# Patient Record
Sex: Female | Born: 1955 | Race: White | Hispanic: No | Marital: Married
Health system: Southern US, Community
[De-identification: ages and names within clinical notes are randomized; demographics above are authoritative.]

---

## 2010-04-07 ENCOUNTER — Ambulatory Visit: Payer: Self-pay | Admitting: Family Medicine

## 2010-04-16 ENCOUNTER — Inpatient Hospital Stay: Payer: Self-pay | Admitting: Surgery

## 2010-04-21 LAB — PATHOLOGY REPORT

## 2010-05-05 ENCOUNTER — Ambulatory Visit: Payer: Self-pay | Admitting: Internal Medicine

## 2010-05-24 ENCOUNTER — Ambulatory Visit: Payer: Self-pay | Admitting: Internal Medicine

## 2010-06-24 ENCOUNTER — Ambulatory Visit: Payer: Self-pay | Admitting: Internal Medicine

## 2010-07-11 ENCOUNTER — Ambulatory Visit: Payer: Self-pay | Admitting: Surgery

## 2010-07-13 ENCOUNTER — Ambulatory Visit: Payer: Self-pay | Admitting: Surgery

## 2010-07-24 ENCOUNTER — Ambulatory Visit: Payer: Self-pay | Admitting: Internal Medicine

## 2010-08-24 ENCOUNTER — Ambulatory Visit: Payer: Self-pay | Admitting: Internal Medicine

## 2010-09-24 ENCOUNTER — Ambulatory Visit: Payer: Self-pay | Admitting: Internal Medicine

## 2010-10-24 ENCOUNTER — Ambulatory Visit: Payer: Self-pay | Admitting: Internal Medicine

## 2010-11-24 ENCOUNTER — Ambulatory Visit: Payer: Self-pay | Admitting: Internal Medicine

## 2010-12-24 ENCOUNTER — Ambulatory Visit: Payer: Self-pay | Admitting: Internal Medicine

## 2010-12-30 LAB — CEA: CEA: 2.5 ng/mL (ref 0.0–4.7)

## 2011-01-12 ENCOUNTER — Inpatient Hospital Stay: Payer: Self-pay | Admitting: Internal Medicine

## 2011-01-24 ENCOUNTER — Ambulatory Visit: Payer: Self-pay | Admitting: Internal Medicine

## 2011-01-26 LAB — BASIC METABOLIC PANEL
Calcium, Total: 8.9 mg/dL (ref 8.5–10.1)
Co2: 30 mmol/L (ref 21–32)
EGFR (Non-African Amer.): >60
Osmolality: 274 (ref 275–301)
Potassium: 3.1 mmol/L — ABNORMAL LOW (ref 3.5–5.1)
Sodium: 136 mmol/L (ref 136–145)

## 2011-01-26 LAB — CBC CANCER CENTER
Eosinophil %: 0.1 %
Monocyte #: 0.4 x10 3/mm (ref 0.0–0.7)
Monocyte %: 6.9 %
Neutrophil %: 73.5 %
Platelet: 241 x10 3/mm (ref 150–440)
RBC: 4.2 10*6/uL (ref 3.80–5.20)
WBC: 5.1 x10 3/mm (ref 3.6–11.0)

## 2011-01-26 LAB — HEPATIC FUNCTION PANEL A (ARMC)
Albumin: 3.8 g/dL (ref 3.4–5.0)
Bilirubin, Direct: 0.2 mg/dL (ref 0.00–0.20)
SGOT(AST): 61 U/L — ABNORMAL HIGH (ref 15–37)
Total Protein: 7.4 g/dL (ref 6.4–8.2)

## 2011-01-31 ENCOUNTER — Inpatient Hospital Stay: Payer: Self-pay | Admitting: Internal Medicine

## 2011-01-31 LAB — COMPREHENSIVE METABOLIC PANEL
Alkaline Phosphatase: 210 U/L — ABNORMAL HIGH (ref 50–136)
BUN: 10 mg/dL (ref 7–18)
Bilirubin,Total: 0.5 mg/dL (ref 0.2–1.0)
Co2: 29 mmol/L (ref 21–32)
Creatinine: 0.65 mg/dL (ref 0.60–1.30)
EGFR (Non-African Amer.): >60
Osmolality: 273 (ref 275–301)
Potassium: 3.4 mmol/L — ABNORMAL LOW (ref 3.5–5.1)
SGPT (ALT): 34 U/L
Sodium: 137 mmol/L (ref 136–145)
Total Protein: 6.9 g/dL (ref 6.4–8.2)

## 2011-01-31 LAB — CBC WITH DIFFERENTIAL/PLATELET
Basophil %: 0.4 %
Eosinophil #: 0 10*3/uL (ref 0.0–0.7)
Eosinophil %: 0.1 %
HCT: 35 % (ref 35.0–47.0)
HGB: 11.1 g/dL — ABNORMAL LOW (ref 12.0–16.0)
MCH: 26.5 pg (ref 26.0–34.0)
MCHC: 31.8 g/dL — ABNORMAL LOW (ref 32.0–36.0)
MCV: 84 fL (ref 80–100)
Monocyte #: 0.4 10*3/uL (ref 0.0–0.7)
Neutrophil #: 3.1 10*3/uL (ref 1.4–6.5)
Neutrophil %: 62 %
Platelet: 210 10*3/uL (ref 150–440)
RBC: 4.19 10*6/uL (ref 3.80–5.20)

## 2011-01-31 LAB — PROTIME-INR: Prothrombin Time: 14.6 secs (ref 11.5–14.7)

## 2011-02-01 LAB — BASIC METABOLIC PANEL
Anion Gap: 9 (ref 7–16)
Calcium, Total: 8.4 mg/dL — ABNORMAL LOW (ref 8.5–10.1)
Chloride: 98 mmol/L (ref 98–107)
Co2: 28 mmol/L (ref 21–32)
Creatinine: 0.77 mg/dL (ref 0.60–1.30)
EGFR (African American): >60
Osmolality: 269 (ref 275–301)
Potassium: 3 mmol/L — ABNORMAL LOW (ref 3.5–5.1)
Sodium: 135 mmol/L — ABNORMAL LOW (ref 136–145)

## 2011-02-01 LAB — CBC WITH DIFFERENTIAL/PLATELET
Eosinophil #: 0 10*3/uL (ref 0.0–0.7)
Eosinophil %: 0.7 %
Lymphocyte #: 1.4 10*3/uL (ref 1.0–3.6)
MCH: 26.8 pg (ref 26.0–34.0)
MCHC: 32.6 g/dL (ref 32.0–36.0)
MCV: 82 fL (ref 80–100)
Monocyte #: 0.3 10*3/uL (ref 0.0–0.7)
Neutrophil %: 49.7 %
Platelet: 174 10*3/uL (ref 150–440)
RBC: 3.75 10*6/uL — ABNORMAL LOW (ref 3.80–5.20)
RDW: 17.5 % — ABNORMAL HIGH (ref 11.5–14.5)

## 2011-02-02 LAB — CBC WITH DIFFERENTIAL/PLATELET
Basophil #: 0 10*3/uL (ref 0.0–0.1)
Basophil %: 0.6 %
Eosinophil #: 0 10*3/uL (ref 0.0–0.7)
HCT: 29.6 % — ABNORMAL LOW (ref 35.0–47.0)
HGB: 9.6 g/dL — ABNORMAL LOW (ref 12.0–16.0)
MCH: 26.8 pg (ref 26.0–34.0)
MCHC: 32.5 g/dL (ref 32.0–36.0)
Monocyte #: 0.2 10*3/uL (ref 0.0–0.7)
Monocyte %: 10.3 %
Neutrophil #: 1.1 10*3/uL — ABNORMAL LOW (ref 1.4–6.5)
Neutrophil %: 46.5 %
RDW: 17.9 % — ABNORMAL HIGH (ref 11.5–14.5)
WBC: 2.4 10*3/uL — ABNORMAL LOW (ref 3.6–11.0)

## 2011-02-02 LAB — BASIC METABOLIC PANEL
Anion Gap: 7 (ref 7–16)
BUN: 5 mg/dL — ABNORMAL LOW (ref 7–18)
Calcium, Total: 7.5 mg/dL — ABNORMAL LOW (ref 8.5–10.1)
Chloride: 109 mmol/L — ABNORMAL HIGH (ref 98–107)
Creatinine: 0.71 mg/dL (ref 0.60–1.30)
Glucose: 103 mg/dL — ABNORMAL HIGH (ref 65–99)
Osmolality: 286 (ref 275–301)
Potassium: 3.4 mmol/L — ABNORMAL LOW (ref 3.5–5.1)

## 2011-02-02 LAB — MAGNESIUM: Magnesium: 1.7 mg/dL — ABNORMAL LOW

## 2011-02-03 LAB — CBC WITH DIFFERENTIAL/PLATELET
Basophil #: 0 10*3/uL (ref 0.0–0.1)
HCT: 25.2 % — ABNORMAL LOW (ref 35.0–47.0)
Lymphocyte #: 0.8 10*3/uL — ABNORMAL LOW (ref 1.0–3.6)
MCH: 26.8 pg (ref 26.0–34.0)
MCHC: 32.5 g/dL (ref 32.0–36.0)
MCV: 83 fL (ref 80–100)
Monocyte #: 0.1 10*3/uL (ref 0.0–0.7)
Monocyte %: 8.7 %
Neutrophil #: 0.8 10*3/uL — ABNORMAL LOW (ref 1.4–6.5)
Platelet: 113 10*3/uL — ABNORMAL LOW (ref 150–440)
RDW: 17.4 % — ABNORMAL HIGH (ref 11.5–14.5)
WBC: 1.7 10*3/uL — CL (ref 3.6–11.0)

## 2011-02-03 LAB — BASIC METABOLIC PANEL
Anion Gap: 10 (ref 7–16)
BUN: 5 mg/dL — ABNORMAL LOW (ref 7–18)
Calcium, Total: 7.5 mg/dL — ABNORMAL LOW (ref 8.5–10.1)
Co2: 25 mmol/L (ref 21–32)
Creatinine: 0.62 mg/dL (ref 0.60–1.30)
EGFR (African American): >60
EGFR (Non-African Amer.): >60
Glucose: 83 mg/dL (ref 65–99)
Sodium: 145 mmol/L (ref 136–145)

## 2011-02-04 LAB — CBC WITH DIFFERENTIAL/PLATELET
Basophil #: 0.1 10*3/uL (ref 0.0–0.1)
Basophil %: 1.1 %
Eosinophil #: 0.1 10*3/uL (ref 0.0–0.7)
HCT: 34.3 % — ABNORMAL LOW (ref 35.0–47.0)
HGB: 11.6 g/dL — ABNORMAL LOW (ref 12.0–16.0)
Lymphocyte #: 0.9 10*3/uL — ABNORMAL LOW (ref 1.0–3.6)
Lymphocyte %: 11.7 %
MCH: 28.1 pg (ref 26.0–34.0)
MCHC: 33.7 g/dL (ref 32.0–36.0)
MCV: 83 fL (ref 80–100)
Monocyte #: 0.3 10*3/uL (ref 0.0–0.7)
Neutrophil #: 6.5 10*3/uL (ref 1.4–6.5)
Platelet: 129 10*3/uL — ABNORMAL LOW (ref 150–440)
RDW: 16.5 % — ABNORMAL HIGH (ref 11.5–14.5)

## 2011-02-24 ENCOUNTER — Ambulatory Visit: Payer: Self-pay | Admitting: Internal Medicine

## 2011-07-14 ENCOUNTER — Ambulatory Visit: Payer: Self-pay | Admitting: Oncology

## 2011-07-14 LAB — CBC CANCER CENTER
Basophil #: 0 x10 3/mm (ref 0.0–0.1)
Eosinophil #: 0.1 x10 3/mm (ref 0.0–0.7)
MCH: 21.3 pg — ABNORMAL LOW (ref 26.0–34.0)
MCHC: 31.3 g/dL — ABNORMAL LOW (ref 32.0–36.0)
MCV: 68 fL — ABNORMAL LOW (ref 80–100)
Monocyte #: 0.3 x10 3/mm (ref 0.2–0.9)
Neutrophil #: 2.7 x10 3/mm (ref 1.4–6.5)
Platelet: 189 x10 3/mm (ref 150–440)
RBC: 5.39 10*6/uL — ABNORMAL HIGH (ref 3.80–5.20)
RDW: 17 % — ABNORMAL HIGH (ref 11.5–14.5)

## 2011-07-14 LAB — COMPREHENSIVE METABOLIC PANEL
Alkaline Phosphatase: 466 U/L — ABNORMAL HIGH (ref 50–136)
Anion Gap: 10 (ref 7–16)
BUN: 11 mg/dL (ref 7–18)
Calcium, Total: 8.5 mg/dL (ref 8.5–10.1)
Co2: 24 mmol/L (ref 21–32)
EGFR (Non-African Amer.): >60
Glucose: 117 mg/dL — ABNORMAL HIGH (ref 65–99)
Osmolality: 276 (ref 275–301)
Potassium: 3.2 mmol/L — ABNORMAL LOW (ref 3.5–5.1)
SGPT (ALT): 56 U/L

## 2011-07-19 ENCOUNTER — Ambulatory Visit: Payer: Self-pay | Admitting: Oncology

## 2011-07-24 ENCOUNTER — Ambulatory Visit: Payer: Self-pay | Admitting: Oncology

## 2011-08-10 LAB — CBC CANCER CENTER
Eosinophil #: 0.1 x10 3/mm (ref 0.0–0.7)
Eosinophil %: 1.3 %
Lymphocyte #: 1.4 x10 3/mm (ref 1.0–3.6)
MCH: 21.8 pg — ABNORMAL LOW (ref 26.0–34.0)
MCHC: 30.8 g/dL — ABNORMAL LOW (ref 32.0–36.0)
Monocyte #: 0.3 x10 3/mm (ref 0.2–0.9)
Monocyte %: 5.3 %
Neutrophil #: 3.5 x10 3/mm (ref 1.4–6.5)
Platelet: 161 x10 3/mm (ref 150–440)
RBC: 5.59 10*6/uL — ABNORMAL HIGH (ref 3.80–5.20)

## 2011-08-10 LAB — COMPREHENSIVE METABOLIC PANEL
Albumin: 3.7 g/dL (ref 3.4–5.0)
Anion Gap: 12 (ref 7–16)
BUN: 9 mg/dL (ref 7–18)
Calcium, Total: 8.3 mg/dL — ABNORMAL LOW (ref 8.5–10.1)
EGFR (African American): >60
Glucose: 108 mg/dL — ABNORMAL HIGH (ref 65–99)
Potassium: 3.5 mmol/L (ref 3.5–5.1)
SGOT(AST): 56 U/L — ABNORMAL HIGH (ref 15–37)
SGPT (ALT): 66 U/L
Sodium: 140 mmol/L (ref 136–145)
Total Protein: 6.4 g/dL (ref 6.4–8.2)

## 2011-08-24 ENCOUNTER — Ambulatory Visit: Payer: Self-pay | Admitting: Oncology

## 2011-08-25 LAB — CBC CANCER CENTER
Basophil %: 1.1 %
Eosinophil #: 0.1 x10 3/mm (ref 0.0–0.7)
HCT: 34.8 % — ABNORMAL LOW (ref 35.0–47.0)
HGB: 10.9 g/dL — ABNORMAL LOW (ref 12.0–16.0)
Lymphocyte #: 1.2 x10 3/mm (ref 1.0–3.6)
Lymphocyte %: 43.2 %
MCHC: 31.4 g/dL — ABNORMAL LOW (ref 32.0–36.0)
Monocyte %: 6.4 %
Neutrophil #: 1.3 x10 3/mm — ABNORMAL LOW (ref 1.4–6.5)
WBC: 2.8 x10 3/mm — ABNORMAL LOW (ref 3.6–11.0)

## 2011-08-25 LAB — COMPREHENSIVE METABOLIC PANEL
Albumin: 3.5 g/dL (ref 3.4–5.0)
Alkaline Phosphatase: 408 U/L — ABNORMAL HIGH (ref 50–136)
Anion Gap: 8 (ref 7–16)
BUN: 9 mg/dL (ref 7–18)
EGFR (African American): >60
EGFR (Non-African Amer.): >60
Glucose: 104 mg/dL — ABNORMAL HIGH (ref 65–99)
Osmolality: 282 (ref 275–301)
Potassium: 3.5 mmol/L (ref 3.5–5.1)
SGOT(AST): 69 U/L — ABNORMAL HIGH (ref 15–37)
SGPT (ALT): 105 U/L — ABNORMAL HIGH

## 2011-08-25 LAB — MAGNESIUM: Magnesium: 1.6 mg/dL — ABNORMAL LOW

## 2011-09-02 IMAGING — CT CT CHEST-ABD-PELV W/ CM
1 series · 1 of 1 positions shown, 3 images · non-contrast
Comparison: none

REASON FOR EXAM: staging advanced colon CA
COMMENTS:

[Series 1002: review_genericreadingtask result images · 0.72mm/px · 1 of 1 slices shown, 3 images]
[im 1/1  mediastinal]
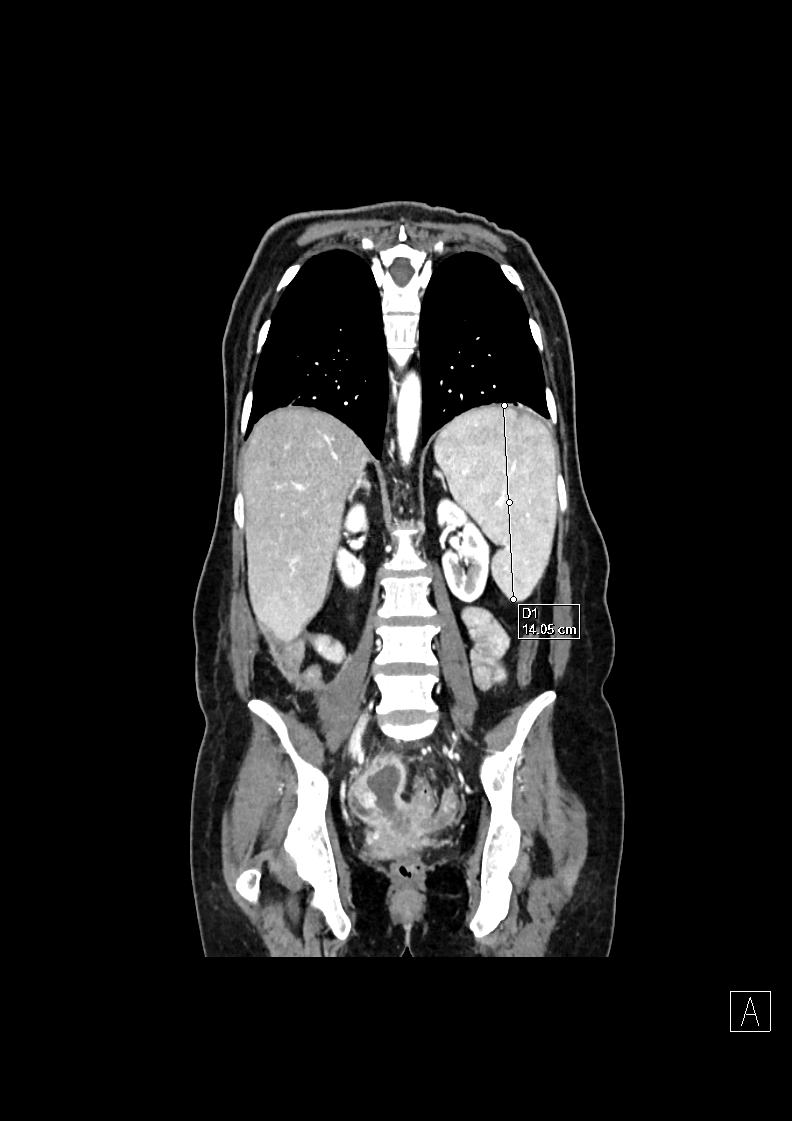
[im 1/1  lung]
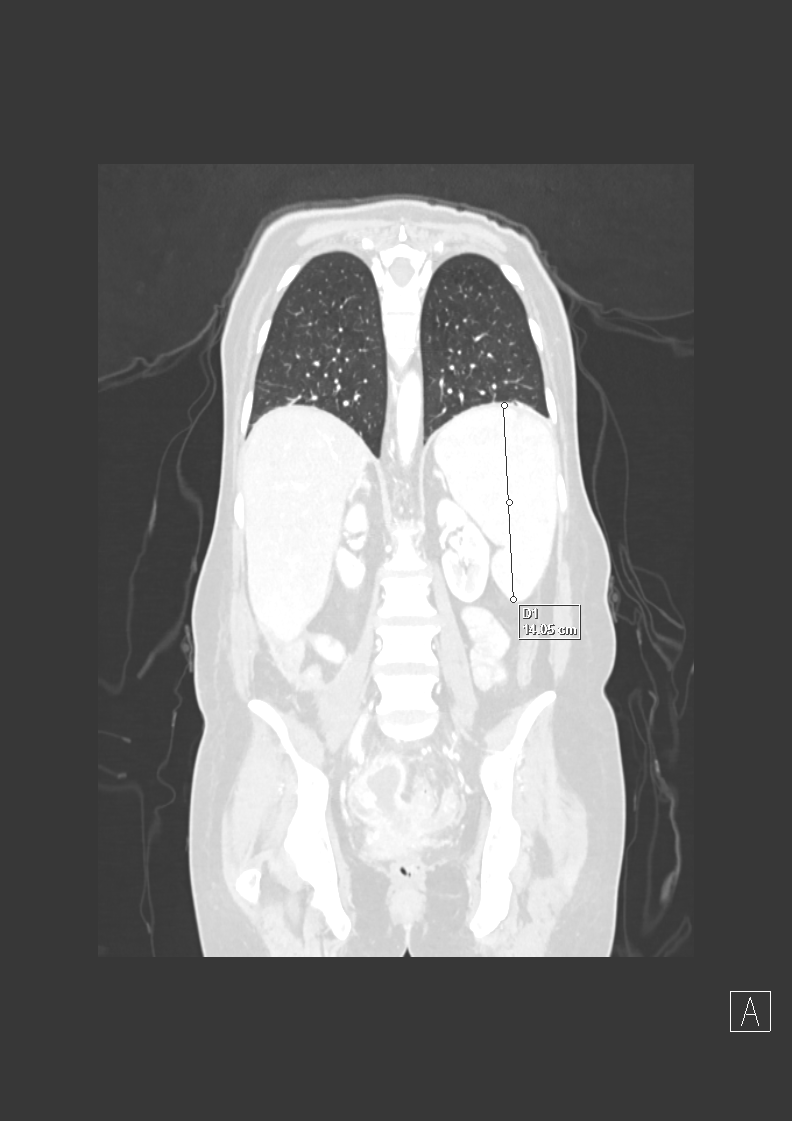
[im 1/1  bone]
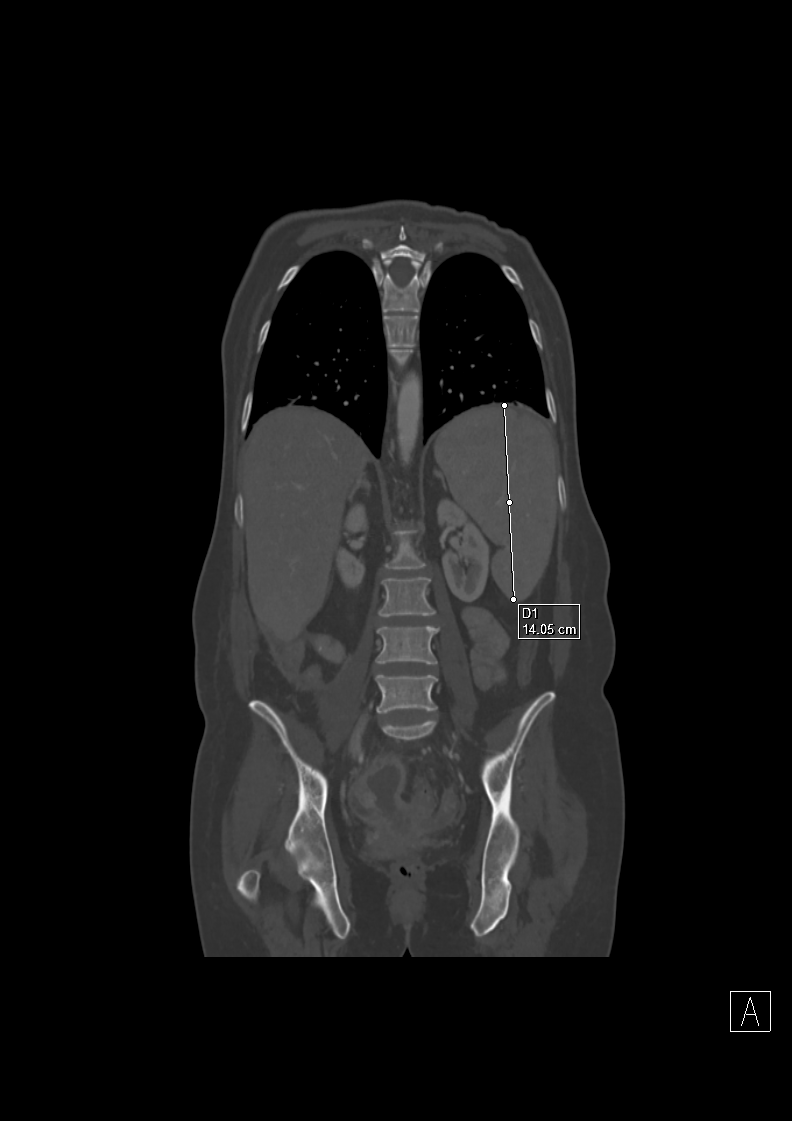

[1 of 1 positions shown; findings below may reference images not displayed]

PROCEDURE:     KCT - KCT CHEST ABDOMEN AND PELVIS W  - May 09, 2010 [DATE]

RESULT:     Axial CT scanning was performed through the chest, abdomen, and
pelvis at 5 mm intervals and slice thicknesses following intravenous
administration of 100 cc of Msovue-1UF. Review of multiplanar reconstructed
images was performed separately on the VIA monitor. The patient was unable
to drink more than a small amount of oral contrast due to nausea. There are
no previous studies for comparison. The patient reportedly is approximately
3 weeks out from the colonic resection and colostomy creation.

CT scan of the chest: At lung window settings there are mild emphysematous
changes bilaterally. I see no pulmonary parenchymal masses or infiltrates.
There is no pleural nor pericardial effusion. The cardiac chambers are
normal in size. The caliber of the thoracic aorta is normal. I see no
pathologic sized mediastinal or hilar lymph nodes. The thoracic vertebral
bodies are preserved in height.
CONCLUSION: I do not see evidence of acute intrathoracic abnormality.

CT scan of the abdomen and pelvis: The liver exhibits normal density with no
focal mass nor ductal dilation. The gallbladder is distended. I see no wall
thickening or pericholecystic fluid or stones. The pancreas exhibits no
acute abnormality. The spleen is mildly enlarged measuring 14 cm in greatest
dimension. The stomach is partially distended with contrast and fluid.

The patient has apparently undergone right colonic resection. An ileostomy
has been created on the right. A small amount of peritoneal fat has
herniated into the soft tissues surrounding the ileostomy. The mucous
fistula of the left colon lies in a left-sided ostomy site. The left colon
is collapsed. There are surgical clips in the inferior epigastrium. There is
a small amount of loculated fluid present deep to the rectus abdominal
musculature. This is closely applied to loops of bowel in this region. There
is no evidence of small bowel obstruction.

Within the pelvis there is a multiloculated fluid collection. This lies
posterior to the uterus and anterior to the rectum. It measures
approximately 3.4 cm transversely by 2.8 cm AP and appears multiloculated.

The lumbar vertebral bodies are preserved in height. I do not see bulky
intra-abdominal nor pelvic lymphadenopathy. The caliber of the abdominal
aorta is normal. The urinary bladder is nondistended. The kidneys enhance
well. There are no adrenal masses.
IMPRESSION: 1. Please see the discussion above regarding findings in the thorax.
2. There are fluid collections in the inferior epigastrium as well as within
the pelvis. These may reflect seromas or hematomas or possibly abscesses.
They appear multiloculated. There is a small amount of free fluid in the
left perirectal region.
3. There is mild splenomegaly.
4. There is dilation the gallbladder which may be related to the relative
fasting state.
5. There is a small amount of peritoneal fat that has herniated into the
right-sided ileostomy site I do not see evidence of bowel obstruction nor
free extraluminal gas collections.

A brief report was called by me and left on Dr. Ebadat voice mail at [DATE]
a.m. on 09 May, 2010. The final report was released for review at
approximately [DATE] a.m.(*)

## 2011-09-08 LAB — COMPREHENSIVE METABOLIC PANEL
Albumin: 3.7 g/dL (ref 3.4–5.0)
Alkaline Phosphatase: 471 U/L — ABNORMAL HIGH (ref 50–136)
BUN: 10 mg/dL (ref 7–18)
Bilirubin,Total: 0.5 mg/dL (ref 0.2–1.0)
Co2: 27 mmol/L (ref 21–32)
Creatinine: 0.9 mg/dL (ref 0.60–1.30)
EGFR (Non-African Amer.): >60
SGPT (ALT): 110 U/L — ABNORMAL HIGH (ref 12–78)

## 2011-09-08 LAB — CBC CANCER CENTER
Basophil #: 0 x10 3/mm (ref 0.0–0.1)
Basophil %: 0.5 %
Eosinophil #: 0 x10 3/mm (ref 0.0–0.7)
HGB: 11.6 g/dL — ABNORMAL LOW (ref 12.0–16.0)
Lymphocyte %: 35.3 %
MCH: 23 pg — ABNORMAL LOW (ref 26.0–34.0)
MCHC: 31.6 g/dL — ABNORMAL LOW (ref 32.0–36.0)
MCV: 73 fL — ABNORMAL LOW (ref 80–100)
Monocyte #: 0.2 x10 3/mm (ref 0.2–0.9)
Neutrophil %: 56 %
Platelet: 120 x10 3/mm — ABNORMAL LOW (ref 150–440)

## 2011-09-09 LAB — CEA: CEA: 6.5 ng/mL — ABNORMAL HIGH (ref 0.0–4.7)

## 2011-09-22 LAB — CBC CANCER CENTER
Basophil #: 0 x10 3/mm (ref 0.0–0.1)
Basophil %: 0.5 %
Eosinophil %: 0.1 %
HGB: 12.7 g/dL (ref 12.0–16.0)
Lymphocyte #: 1.5 x10 3/mm (ref 1.0–3.6)
MCH: 24.1 pg — ABNORMAL LOW (ref 26.0–34.0)
MCV: 75 fL — ABNORMAL LOW (ref 80–100)
Monocyte #: 0.4 x10 3/mm (ref 0.2–0.9)
Platelet: 141 x10 3/mm — ABNORMAL LOW (ref 150–440)
RBC: 5.26 10*6/uL — ABNORMAL HIGH (ref 3.80–5.20)
RDW: 23 % — ABNORMAL HIGH (ref 11.5–14.5)

## 2011-09-22 LAB — COMPREHENSIVE METABOLIC PANEL
Albumin: 3.7 g/dL (ref 3.4–5.0)
Alkaline Phosphatase: 539 U/L — ABNORMAL HIGH (ref 50–136)
Anion Gap: 8 (ref 7–16)
BUN: 10 mg/dL (ref 7–18)
Bilirubin,Total: 0.9 mg/dL (ref 0.2–1.0)
Calcium, Total: 8.9 mg/dL (ref 8.5–10.1)
Co2: 29 mmol/L (ref 21–32)
EGFR (African American): 58 — ABNORMAL LOW
EGFR (Non-African Amer.): 50 — ABNORMAL LOW
Glucose: 116 mg/dL — ABNORMAL HIGH (ref 65–99)
SGOT(AST): 88 U/L — ABNORMAL HIGH (ref 15–37)
SGPT (ALT): 143 U/L — ABNORMAL HIGH (ref 12–78)

## 2011-09-24 ENCOUNTER — Ambulatory Visit: Payer: Self-pay | Admitting: Oncology

## 2011-09-29 LAB — COMPREHENSIVE METABOLIC PANEL
Albumin: 3.6 g/dL (ref 3.4–5.0)
Alkaline Phosphatase: 403 U/L — ABNORMAL HIGH (ref 50–136)
Anion Gap: 8 (ref 7–16)
BUN: 12 mg/dL (ref 7–18)
Calcium, Total: 8.7 mg/dL (ref 8.5–10.1)
Co2: 26 mmol/L (ref 21–32)
Creatinine: 0.91 mg/dL (ref 0.60–1.30)
Glucose: 97 mg/dL (ref 65–99)
Osmolality: 281 (ref 275–301)
Sodium: 141 mmol/L (ref 136–145)
Total Protein: 6.3 g/dL — ABNORMAL LOW (ref 6.4–8.2)

## 2011-09-29 LAB — CBC CANCER CENTER
Basophil #: 0 x10 3/mm (ref 0.0–0.1)
Basophil %: 0.8 %
Eosinophil #: 0.1 x10 3/mm (ref 0.0–0.7)
HCT: 37.2 % (ref 35.0–47.0)
HGB: 11.9 g/dL — ABNORMAL LOW (ref 12.0–16.0)
Lymphocyte #: 1.3 x10 3/mm (ref 1.0–3.6)
Lymphocyte %: 40.6 %
MCH: 24.4 pg — ABNORMAL LOW (ref 26.0–34.0)
MCHC: 31.9 g/dL — ABNORMAL LOW (ref 32.0–36.0)
MCV: 76 fL — ABNORMAL LOW (ref 80–100)
Monocyte #: 0.3 x10 3/mm (ref 0.2–0.9)
Neutrophil #: 1.5 x10 3/mm (ref 1.4–6.5)
RDW: 24.5 % — ABNORMAL HIGH (ref 11.5–14.5)

## 2011-10-13 LAB — COMPREHENSIVE METABOLIC PANEL
Albumin: 3.3 g/dL — ABNORMAL LOW (ref 3.4–5.0)
Alkaline Phosphatase: 364 U/L — ABNORMAL HIGH (ref 50–136)
Bilirubin,Total: 0.5 mg/dL (ref 0.2–1.0)
Calcium, Total: 7.9 mg/dL — ABNORMAL LOW (ref 8.5–10.1)
Co2: 25 mmol/L (ref 21–32)
EGFR (Non-African Amer.): >60
Glucose: 102 mg/dL — ABNORMAL HIGH (ref 65–99)
Osmolality: 283 (ref 275–301)
SGOT(AST): 32 U/L (ref 15–37)
Sodium: 142 mmol/L (ref 136–145)

## 2011-10-13 LAB — CBC CANCER CENTER
Basophil #: 0 x10 3/mm (ref 0.0–0.1)
Basophil %: 0.9 %
HCT: 34.2 % — ABNORMAL LOW (ref 35.0–47.0)
Lymphocyte #: 0.9 x10 3/mm — ABNORMAL LOW (ref 1.0–3.6)
Lymphocyte %: 29.9 %
MCHC: 32.2 g/dL (ref 32.0–36.0)
MCV: 78 fL — ABNORMAL LOW (ref 80–100)
Monocyte #: 0.2 x10 3/mm (ref 0.2–0.9)
Monocyte %: 6.5 %
Neutrophil #: 1.8 x10 3/mm (ref 1.4–6.5)
Platelet: 96 x10 3/mm — ABNORMAL LOW (ref 150–440)
RDW: 23.1 % — ABNORMAL HIGH (ref 11.5–14.5)
WBC: 3 x10 3/mm — ABNORMAL LOW (ref 3.6–11.0)

## 2011-10-13 LAB — MAGNESIUM: Magnesium: 1.4 mg/dL — ABNORMAL LOW

## 2011-10-16 LAB — CEA: CEA: 5 ng/mL — ABNORMAL HIGH (ref 0.0–4.7)

## 2011-10-20 LAB — CBC CANCER CENTER
Basophil #: 0 x10 3/mm (ref 0.0–0.1)
Eosinophil %: 4.6 %
Lymphocyte #: 0.9 x10 3/mm — ABNORMAL LOW (ref 1.0–3.6)
MCH: 25.5 pg — ABNORMAL LOW (ref 26.0–34.0)
MCV: 79 fL — ABNORMAL LOW (ref 80–100)
Monocyte #: 0.3 x10 3/mm (ref 0.2–0.9)
Monocyte %: 10.9 %
Neutrophil #: 1.4 x10 3/mm (ref 1.4–6.5)
Platelet: 180 x10 3/mm (ref 150–440)
RDW: 22.3 % — ABNORMAL HIGH (ref 11.5–14.5)
WBC: 2.8 x10 3/mm — ABNORMAL LOW (ref 3.6–11.0)

## 2011-10-24 ENCOUNTER — Ambulatory Visit: Payer: Self-pay | Admitting: Oncology

## 2011-11-03 LAB — CBC CANCER CENTER
Basophil #: 0 x10 3/mm (ref 0.0–0.1)
Basophil %: 1.1 %
Eosinophil #: 0.1 x10 3/mm (ref 0.0–0.7)
Eosinophil %: 2.3 %
HGB: 11.8 g/dL — ABNORMAL LOW (ref 12.0–16.0)
Lymphocyte %: 33.3 %
MCH: 25.7 pg — ABNORMAL LOW (ref 26.0–34.0)
MCHC: 32.2 g/dL (ref 32.0–36.0)
Monocyte #: 0.2 x10 3/mm (ref 0.2–0.9)
Neutrophil %: 55.3 %
RDW: 20.9 % — ABNORMAL HIGH (ref 11.5–14.5)

## 2011-11-03 LAB — COMPREHENSIVE METABOLIC PANEL
BUN: 8 mg/dL (ref 7–18)
Bilirubin,Total: 0.7 mg/dL (ref 0.2–1.0)
Chloride: 106 mmol/L (ref 98–107)
Co2: 26 mmol/L (ref 21–32)
Creatinine: 0.73 mg/dL (ref 0.60–1.30)
EGFR (African American): >60
EGFR (Non-African Amer.): >60
Potassium: 3 mmol/L — ABNORMAL LOW (ref 3.5–5.1)
SGOT(AST): 34 U/L (ref 15–37)
SGPT (ALT): 43 U/L (ref 12–78)

## 2011-11-24 ENCOUNTER — Ambulatory Visit: Payer: Self-pay | Admitting: Oncology

## 2011-11-24 LAB — CBC CANCER CENTER
Basophil %: 2.3 %
Eosinophil #: 0.2 x10 3/mm (ref 0.0–0.7)
HCT: 39.2 % (ref 35.0–47.0)
HGB: 12.4 g/dL (ref 12.0–16.0)
Lymphocyte #: 1.2 x10 3/mm (ref 1.0–3.6)
Lymphocyte %: 21.4 %
MCH: 25.4 pg — ABNORMAL LOW (ref 26.0–34.0)
MCHC: 31.8 g/dL — ABNORMAL LOW (ref 32.0–36.0)
MCV: 80 fL (ref 80–100)
Monocyte %: 7.6 %
Neutrophil #: 3.7 x10 3/mm (ref 1.4–6.5)
Platelet: 228 x10 3/mm (ref 150–440)
RDW: 18.7 % — ABNORMAL HIGH (ref 11.5–14.5)
WBC: 5.7 x10 3/mm (ref 3.6–11.0)

## 2011-11-24 LAB — COMPREHENSIVE METABOLIC PANEL
Alkaline Phosphatase: 290 U/L — ABNORMAL HIGH (ref 50–136)
Anion Gap: 10 (ref 7–16)
BUN: 9 mg/dL (ref 7–18)
Calcium, Total: 8.5 mg/dL (ref 8.5–10.1)
Chloride: 104 mmol/L (ref 98–107)
Co2: 25 mmol/L (ref 21–32)
EGFR (Non-African Amer.): >60
Glucose: 109 mg/dL — ABNORMAL HIGH (ref 65–99)
Osmolality: 277 (ref 275–301)
Potassium: 3.6 mmol/L (ref 3.5–5.1)
SGOT(AST): 21 U/L (ref 15–37)
Total Protein: 6.4 g/dL (ref 6.4–8.2)

## 2011-11-27 LAB — CEA: CEA: 6.2 ng/mL — ABNORMAL HIGH (ref 0.0–4.7)

## 2011-12-01 LAB — COMPREHENSIVE METABOLIC PANEL
Albumin: 3.3 g/dL — ABNORMAL LOW (ref 3.4–5.0)
Anion Gap: 11 (ref 7–16)
Calcium, Total: 8.3 mg/dL — ABNORMAL LOW (ref 8.5–10.1)
Co2: 24 mmol/L (ref 21–32)
EGFR (Non-African Amer.): >60
Glucose: 117 mg/dL — ABNORMAL HIGH (ref 65–99)
Osmolality: 282 (ref 275–301)
Potassium: 4 mmol/L (ref 3.5–5.1)
SGOT(AST): 24 U/L (ref 15–37)
SGPT (ALT): 20 U/L (ref 12–78)
Sodium: 141 mmol/L (ref 136–145)
Total Protein: 6.1 g/dL — ABNORMAL LOW (ref 6.4–8.2)

## 2011-12-01 LAB — CBC CANCER CENTER
Basophil #: 0.1 x10 3/mm (ref 0.0–0.1)
Basophil %: 1.3 %
Eosinophil #: 0.1 x10 3/mm (ref 0.0–0.7)
HCT: 38.7 % (ref 35.0–47.0)
HGB: 12.2 g/dL (ref 12.0–16.0)
Lymphocyte #: 1.5 x10 3/mm (ref 1.0–3.6)
Lymphocyte %: 25.9 %
MCH: 25.1 pg — ABNORMAL LOW (ref 26.0–34.0)
MCHC: 31.6 g/dL — ABNORMAL LOW (ref 32.0–36.0)
Monocyte #: 0.3 x10 3/mm (ref 0.2–0.9)
Neutrophil #: 3.9 x10 3/mm (ref 1.4–6.5)
Platelet: 175 x10 3/mm (ref 150–440)
RDW: 18.2 % — ABNORMAL HIGH (ref 11.5–14.5)

## 2011-12-24 ENCOUNTER — Ambulatory Visit: Payer: Self-pay | Admitting: Oncology

## 2011-12-29 LAB — COMPREHENSIVE METABOLIC PANEL
Alkaline Phosphatase: 162 U/L — ABNORMAL HIGH (ref 50–136)
Anion Gap: 10 (ref 7–16)
Calcium, Total: 7.7 mg/dL — ABNORMAL LOW (ref 8.5–10.1)
Chloride: 110 mmol/L — ABNORMAL HIGH (ref 98–107)
Co2: 25 mmol/L (ref 21–32)
Creatinine: 0.69 mg/dL (ref 0.60–1.30)
EGFR (African American): >60
EGFR (Non-African Amer.): >60
Glucose: 102 mg/dL — ABNORMAL HIGH (ref 65–99)
Osmolality: 289 (ref 275–301)
Potassium: 3.3 mmol/L — ABNORMAL LOW (ref 3.5–5.1)
Sodium: 145 mmol/L (ref 136–145)

## 2011-12-29 LAB — CBC CANCER CENTER
Basophil %: 1 %
Eosinophil %: 1.5 %
HGB: 11.7 g/dL — ABNORMAL LOW (ref 12.0–16.0)
Lymphocyte #: 1.3 x10 3/mm (ref 1.0–3.6)
MCH: 24.8 pg — ABNORMAL LOW (ref 26.0–34.0)
MCV: 78 fL — ABNORMAL LOW (ref 80–100)
Monocyte #: 0.3 x10 3/mm (ref 0.2–0.9)
RDW: 18.9 % — ABNORMAL HIGH (ref 11.5–14.5)

## 2011-12-29 LAB — MAGNESIUM: Magnesium: 1 mg/dL — ABNORMAL LOW

## 2012-01-16 ENCOUNTER — Ambulatory Visit: Payer: Self-pay | Admitting: Oncology

## 2012-01-24 ENCOUNTER — Ambulatory Visit: Payer: Self-pay | Admitting: Oncology

## 2012-01-26 LAB — CBC CANCER CENTER
Basophil #: 0.1 x10 3/mm (ref 0.0–0.1)
Basophil %: 0.8 %
Eosinophil %: 1.8 %
HCT: 40.9 % (ref 35.0–47.0)
HGB: 13.3 g/dL (ref 12.0–16.0)
Lymphocyte #: 1.7 x10 3/mm (ref 1.0–3.6)
MCH: 25.2 pg — ABNORMAL LOW (ref 26.0–34.0)
MCHC: 32.5 g/dL (ref 32.0–36.0)
MCV: 78 fL — ABNORMAL LOW (ref 80–100)
Monocyte #: 0.4 x10 3/mm (ref 0.2–0.9)
Neutrophil #: 4 x10 3/mm (ref 1.4–6.5)
Platelet: 168 x10 3/mm (ref 150–440)
RBC: 5.27 10*6/uL — ABNORMAL HIGH (ref 3.80–5.20)
WBC: 6.2 x10 3/mm (ref 3.6–11.0)

## 2012-01-26 LAB — COMPREHENSIVE METABOLIC PANEL
Alkaline Phosphatase: 242 U/L — ABNORMAL HIGH (ref 50–136)
Calcium, Total: 8.7 mg/dL (ref 8.5–10.1)
Chloride: 102 mmol/L (ref 98–107)
EGFR (African American): >60
Potassium: 3.3 mmol/L — ABNORMAL LOW (ref 3.5–5.1)
Sodium: 143 mmol/L (ref 136–145)
Total Protein: 6.6 g/dL (ref 6.4–8.2)

## 2012-01-27 LAB — CEA: CEA: 5.3 ng/mL — ABNORMAL HIGH (ref 0.0–4.7)

## 2012-02-23 LAB — CBC CANCER CENTER
Basophil #: 0 x10 3/mm (ref 0.0–0.1)
Basophil %: 0.3 %
HCT: 42 % (ref 35.0–47.0)
HGB: 13.5 g/dL (ref 12.0–16.0)
Lymphocyte #: 0.6 x10 3/mm — ABNORMAL LOW (ref 1.0–3.6)
MCV: 78 fL — ABNORMAL LOW (ref 80–100)
Monocyte #: 0.4 x10 3/mm (ref 0.2–0.9)
Monocyte %: 3.7 %
Neutrophil #: 10.9 x10 3/mm — ABNORMAL HIGH (ref 1.4–6.5)
Neutrophil %: 91.2 %
Platelet: 144 x10 3/mm — ABNORMAL LOW (ref 150–440)
RBC: 5.4 10*6/uL — ABNORMAL HIGH (ref 3.80–5.20)

## 2012-02-23 LAB — COMPREHENSIVE METABOLIC PANEL
Albumin: 4.1 g/dL (ref 3.4–5.0)
Anion Gap: 13 (ref 7–16)
BUN: 13 mg/dL (ref 7–18)
Bilirubin,Total: 0.7 mg/dL (ref 0.2–1.0)
Calcium, Total: 8.7 mg/dL (ref 8.5–10.1)
Chloride: 105 mmol/L (ref 98–107)
Creatinine: 0.75 mg/dL (ref 0.60–1.30)
EGFR (Non-African Amer.): >60
Glucose: 104 mg/dL — ABNORMAL HIGH (ref 65–99)
Osmolality: 289 (ref 275–301)
Potassium: 3 mmol/L — ABNORMAL LOW (ref 3.5–5.1)
SGPT (ALT): 47 U/L (ref 12–78)
Sodium: 145 mmol/L (ref 136–145)

## 2012-02-24 ENCOUNTER — Ambulatory Visit: Payer: Self-pay | Admitting: Oncology

## 2012-03-01 LAB — CBC CANCER CENTER
Basophil #: 0.1 x10 3/mm (ref 0.0–0.1)
Basophil %: 0.8 %
Eosinophil #: 0.1 x10 3/mm (ref 0.0–0.7)
Eosinophil %: 1.4 %
Lymphocyte %: 20.8 %
Monocyte #: 0.4 x10 3/mm (ref 0.2–0.9)
Neutrophil #: 5.1 x10 3/mm (ref 1.4–6.5)
Neutrophil %: 71.1 %
RDW: 16.4 % — ABNORMAL HIGH (ref 11.5–14.5)
WBC: 7.1 x10 3/mm (ref 3.6–11.0)

## 2012-03-01 LAB — COMPREHENSIVE METABOLIC PANEL
Alkaline Phosphatase: 204 U/L — ABNORMAL HIGH (ref 50–136)
Anion Gap: 9 (ref 7–16)
BUN: 11 mg/dL (ref 7–18)
Bilirubin,Total: 0.4 mg/dL (ref 0.2–1.0)
Calcium, Total: 8.2 mg/dL — ABNORMAL LOW (ref 8.5–10.1)
Chloride: 106 mmol/L (ref 98–107)
Co2: 27 mmol/L (ref 21–32)
Creatinine: 0.77 mg/dL (ref 0.60–1.30)
EGFR (African American): >60
Glucose: 111 mg/dL — ABNORMAL HIGH (ref 65–99)
Potassium: 3.2 mmol/L — ABNORMAL LOW (ref 3.5–5.1)
SGOT(AST): 31 U/L (ref 15–37)
SGPT (ALT): 35 U/L (ref 12–78)
Total Protein: 6.3 g/dL — ABNORMAL LOW (ref 6.4–8.2)

## 2012-03-23 ENCOUNTER — Ambulatory Visit: Payer: Self-pay | Admitting: Oncology

## 2012-04-05 LAB — COMPREHENSIVE METABOLIC PANEL
BUN: 11 mg/dL (ref 7–18)
Bilirubin,Total: 0.4 mg/dL (ref 0.2–1.0)
Calcium, Total: 8.3 mg/dL — ABNORMAL LOW (ref 8.5–10.1)
Chloride: 99 mmol/L (ref 98–107)
Co2: 30 mmol/L (ref 21–32)
EGFR (Non-African Amer.): >60
Glucose: 93 mg/dL (ref 65–99)
Osmolality: 275 (ref 275–301)
Potassium: 3.4 mmol/L — ABNORMAL LOW (ref 3.5–5.1)
Sodium: 138 mmol/L (ref 136–145)

## 2012-04-05 LAB — CBC CANCER CENTER
Basophil #: 0 x10 3/mm (ref 0.0–0.1)
Eosinophil #: 0 x10 3/mm (ref 0.0–0.7)
HCT: 38.7 % (ref 35.0–47.0)
HGB: 12.5 g/dL (ref 12.0–16.0)
MCH: 24.6 pg — ABNORMAL LOW (ref 26.0–34.0)
Monocyte #: 0.3 x10 3/mm (ref 0.2–0.9)
Monocyte %: 7.2 %
Neutrophil #: 2.6 x10 3/mm (ref 1.4–6.5)
Neutrophil %: 61 %
RBC: 5.09 10*6/uL (ref 3.80–5.20)
RDW: 14.4 % (ref 11.5–14.5)
WBC: 4.3 x10 3/mm (ref 3.6–11.0)

## 2012-04-05 LAB — CREATININE, SERUM
EGFR (African American): >60
EGFR (Non-African Amer.): >60

## 2012-04-06 LAB — CEA: CEA: 3.8 ng/mL (ref 0.0–4.7)

## 2012-04-23 ENCOUNTER — Ambulatory Visit: Payer: Self-pay | Admitting: Oncology

## 2013-07-30 IMAGING — CT CT ABD-PELV W/ CM
1 of 2 series · 15 of 32 positions shown, 19 images · IV contrast (isovue)
Comparison: none

REASON FOR EXAM: Hx of colon CA Increasing abdominal pain
COMMENTS:

PROCEDURE:     MORAUX - MORAUX ABDOMEN / PELVIS W  - April 05, 2012 [DATE]
RESULT:     Comparison:  PET CT 01/16/2012, CT of the abdomen and pelvis
01/30/2011
TECHNIQUE: Multiple axial images of the abdomen and pelvis were performed
from the lung bases to the pubic symphysis, with p.o. contrast and with 75
mL of Isovue 300 intravenous contrast.

[Series 2: soft tissue · axial · 0.67mm/px · z∈[-796,-451]mm · 15 of 127 slices shown, 19 images]
[im 6/127  soft-tissue]
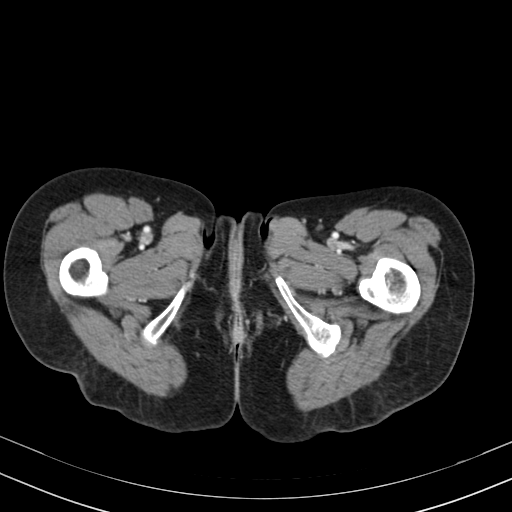
[im 6/127  bone]
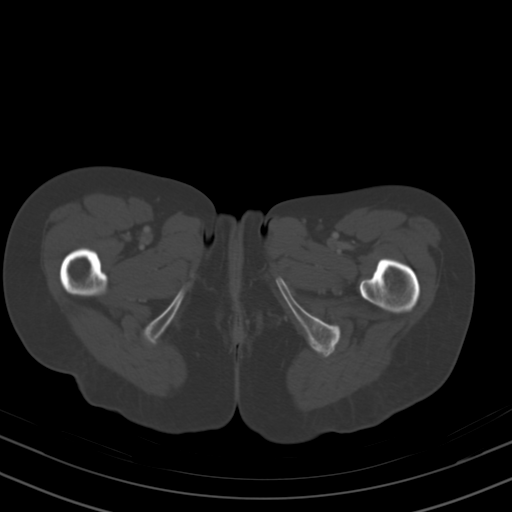
[im 16/127  soft-tissue]
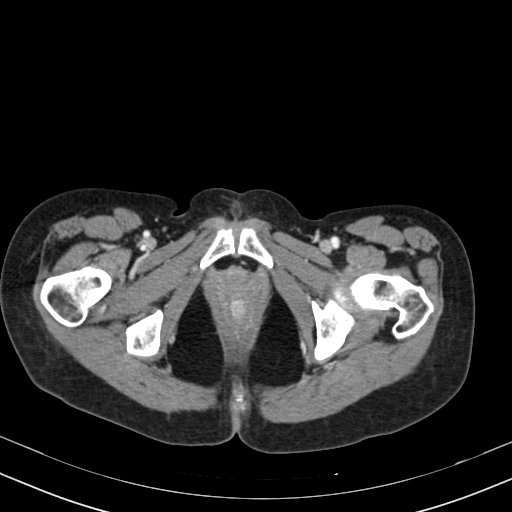
[im 26/127  soft-tissue]
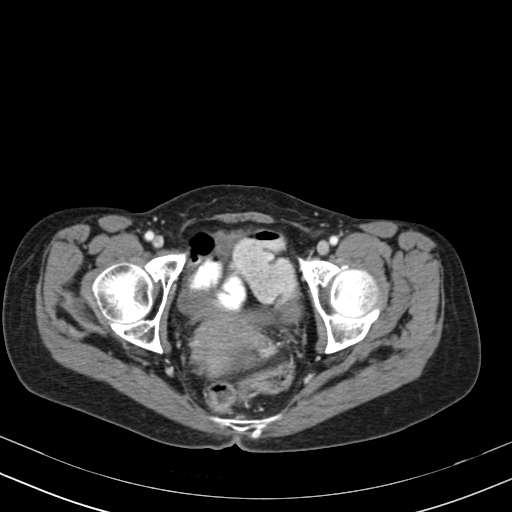
[im 36/127  soft-tissue]
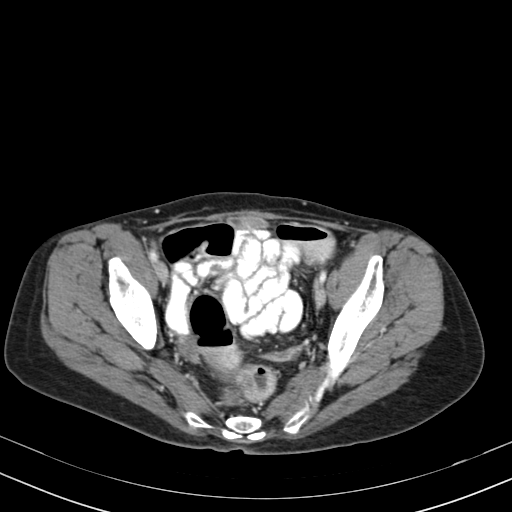
[im 46/127  soft-tissue]
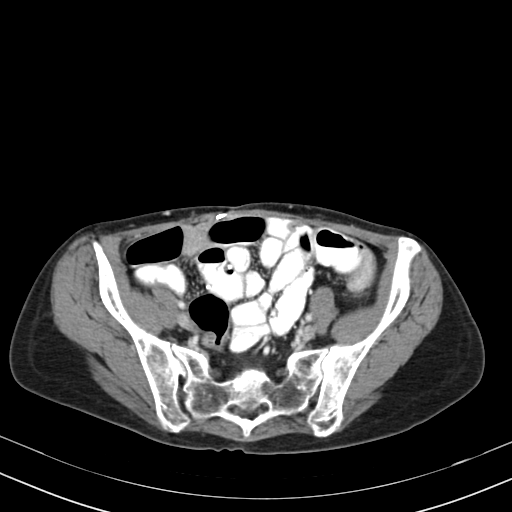
[im 56/127  soft-tissue]
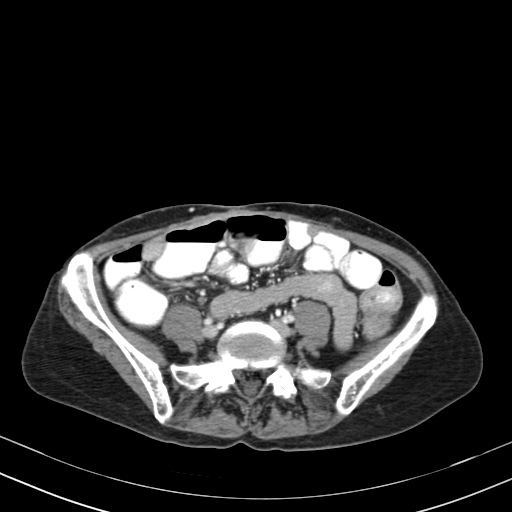
[im 66/127  soft-tissue]
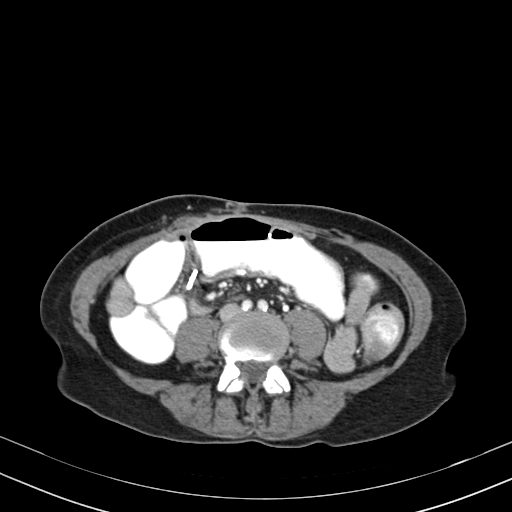
[im 71/127  soft-tissue]
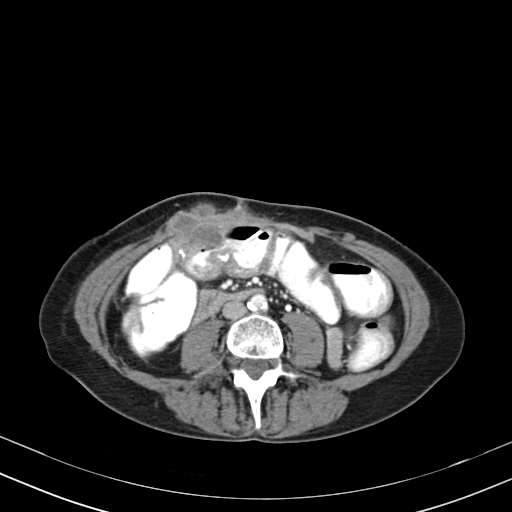
[im 81/127  soft-tissue]
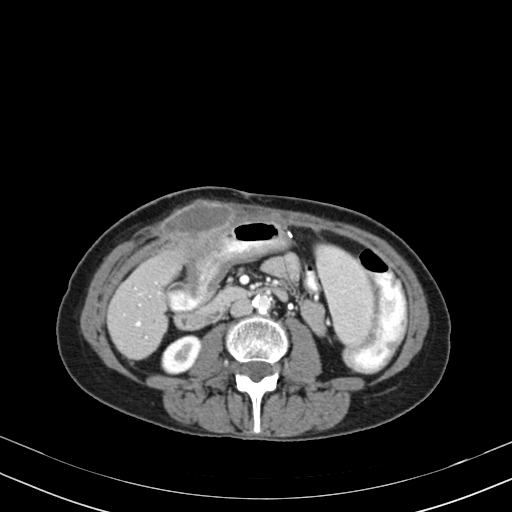
[im 81/127  bone]
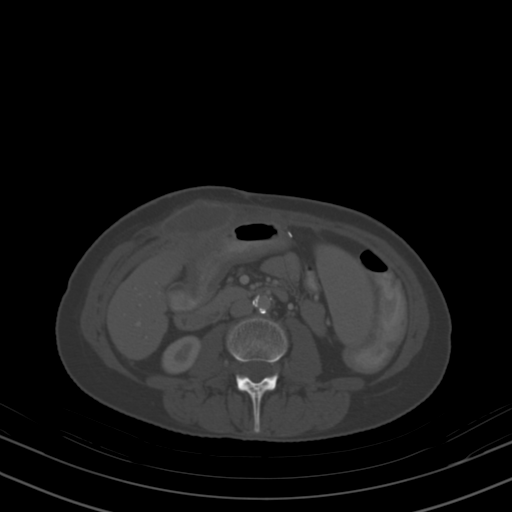
[im 91/127  soft-tissue]
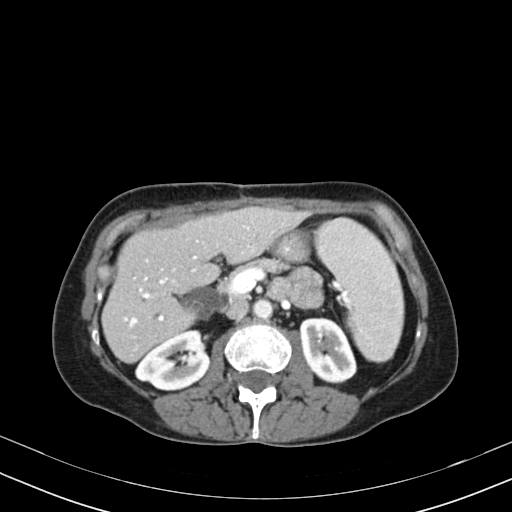
[im 101/127  soft-tissue]
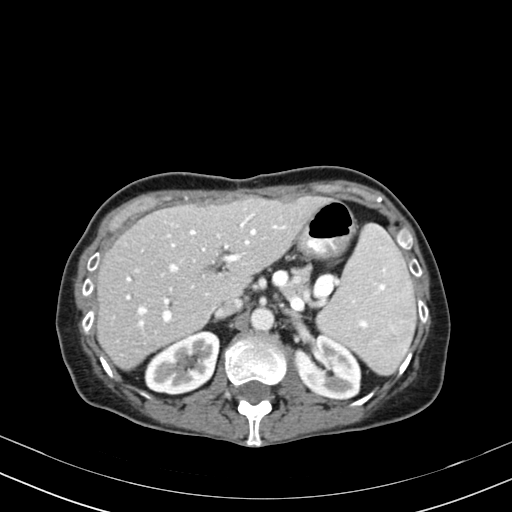
[im 106/127  lung]
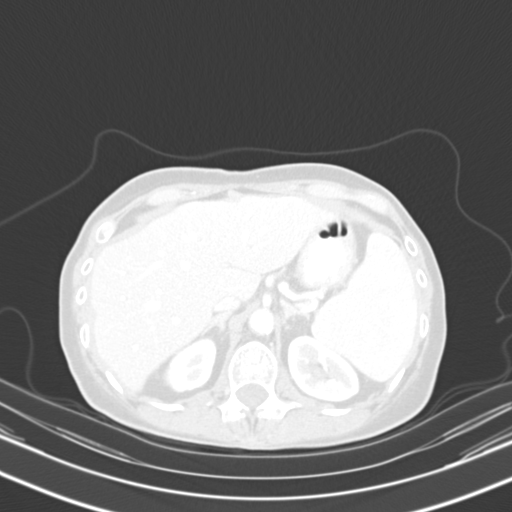
[im 111/127  soft-tissue]
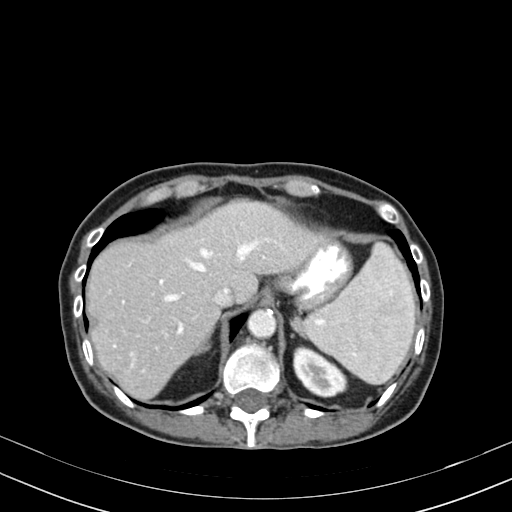
[im 111/127  lung]
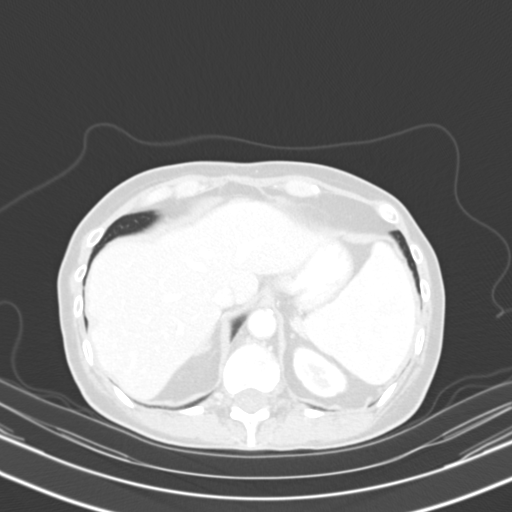
[im 116/127  lung]
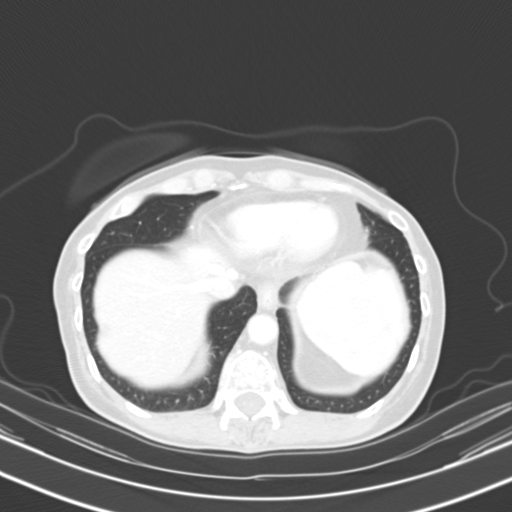
[im 121/127  soft-tissue]
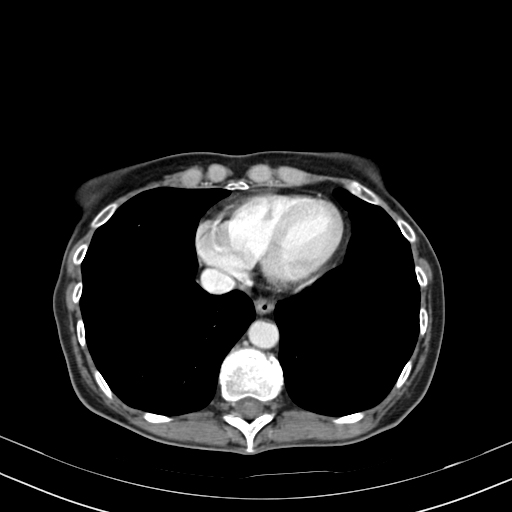
[im 121/127  lung]
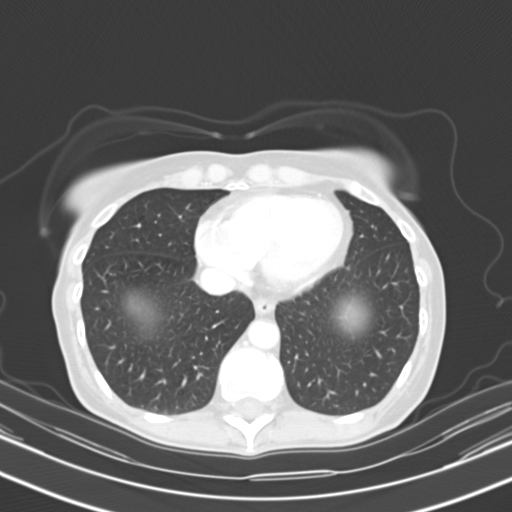

[15 of 32 positions shown; findings below may reference images not displayed]

FINDINGS: There is a 9 mm pulmonary nodule in the medial aspect of the right
costophrenic angle which is similar to prior.

The liver, adrenals, and pancreas are unremarkable. The spleen is mildly
prominent in size, similar to prior. The kidneys enhance normally. There is
a large low attenuation mass involving the right anterior abdominal wall. It
has increased in size from prior. It measures approximately 5.0 x 3.9 cm.
The mass is inseparable from the wall of the stomach. There are multiple
adjacent surgical clips.

Oral contrast material is seen to the level of the sigmoid colon. There is
mild bowel thickening of the descending colon. No significant adjacent
inflammatory change. The small bowel is normal in caliber. The appendix is
not visualized.

No aggressive lytic or sclerotic osseous lesions are identified.
IMPRESSION: 1. Large mass involving the anterior right abdominal wall is increased in
size from prior.
2. Mild bowel wall thickening of the descending colon is nonspecific.
Differential includes infectious or inflammatory colitis as well as
malignancy.

## 2014-02-23 DEATH — deceased

## 2014-05-17 NOTE — Discharge Summary (Signed)
PATIENT NAME:  Sylvia Lewis, Sylvia Lewis MR#:  [id number] DATE OF BIRTH:  11-09-1955  DATE OF ADMISSION:  01/31/2011 DATE OF DISCHARGE:  Planned for transfer to [facility] hospital when bed becomes available.     DIAGNOSES:  1. T4 N0 colon cancer status post right hemicolectomy, transverse colectomy in March 2012 on adjuvant FOLFOX chemotherapy, now having large recurrent upper abdominal tumor consistent with local recurrence, positron emission tomography positive, causing symptoms of partial small bowel obstruction.  2. Nausea, vomiting, clinical dehydration likely from ongoing partial small bowel obstruction, symptoms better.  3.  Pancytopenia - likely delayed myelosuppressive effect of prior chemotherapy  HISTORY OF PRESENT ILLNESS: Patient is a 59 year old female with known history of T4 N0 colon cancer who had surgery done in March 2012. More recently patient has been on adjuvant chemotherapy and had completed about nine cycles of FOLFOX treatment. Most recent chemotherapy was end of November, since then chemotherapy was delayed due to cytopenias, then patient was also hospitalized in December for infection in the upper anterior abdominal wall at which time ultrasound was done which did not show any abscess, this improved with antibiotic therapy. Patient continued to have persistent protrusion and pain in the upper abdominal area. She had CT scan done which reported a large recurrent tumor in the upper abdomen consistent with recurrent colon cancer, no other new metastatic disease was reported. Patient was therefore hospitalized for further care.   PAST MEDICAL HISTORY, SURGICAL HISTORY, FAMILY HISTORY, SOCIAL HISTORY, ALLERGIES, HOME MEDICATIONS, REVIEW OF SYSTEMS, EXAMINATION: Refer to history and physical note for details.   HOSPITAL COURSE: Labs on admission showed creatinine 0.65. Liver functions unremarkable except alkaline phosphatase 210, AST 42, albumin 3.8, hemoglobin 11.1, WBC 5000, platelets 210, ANC  3100. Potassium 3.4. Patient was started on aggressive IV hydration and renal function and electrolytes were monitored. She had some hypokalemia and hypomagnesemia which was supplemented and monitored. Patient was initially n.p.o. and once nausea and vomiting improved she was started on liquid diet which she tolerated well and subsequently progressed to solid diet which she is currently tolerating in small amounts, Ensure also was added. Patient continues to remain ambulatory. Pain is under control. She was evaluated by GI surgical oncologist, Dr. Madaline Brilliant on 01/10 for feasibility of surgical resection of this recurrent mass, PET scan was done as was recommended by him and preliminarily does not show any other sites of disease, mass in the upper abdomen is PET positive alongwith some ascites. Patient is interested in pursuing surgical resection if feasible. Given ongoing possible partial small bowel obstruction, per discussion with Dr. Madaline Brilliant, patient will be transferred to [facility] once bed is available for surgical evaluation. CBC from 01/11 showed ANC down to 800, hemoglobin is down to 8.2 and she is significantly fatigued, platelet count is slightly low at 113K but no bleeding symptoms. She got Neupogen 300 mcg x1 dose today (January 11), alongwith transfusion of 2 units packed red blood cells. She will need continued monitoring of blood counts and transfusion support as indicated.   DISCHARGE MEDICATIONS:  1. Klor-Con powder 20 mEq daily.  2. Magnesium oxide 400 mg b.i.d.  3. Norco 5/325 mg 1 to 2 tablets every six hours p.r.n. for pain.  4. Tylenol p.r.n. for pain or fever. 5. Ondansetron 4 mg every four hours p.r.n. for nausea, vomiting.  6. Phenergan 25 mg q.6 hours p.r.n. for nausea, vomiting.   DIET: Regular, along with Ensure Plus three times daily as tolerated.   FOLLOW UP:  Follow up will be decided [facility]  ____________________________ Maren ReamerSandeep R. Sherrlyn HockPandit,  MD srp:cms D: 02/03/2011 15:55:18 ET T: 02/03/2011 16:14:58 ET JOB#: [id number]  cc: Barba Solt R. Sherrlyn HockPandit, MD, <Dictator> Wille CelesteSANDEEP R Liana Camerer MD ELECTRONICALLY SIGNED 02/03/2011 23:19

## 2014-05-17 NOTE — H&P (Signed)
PATIENT NAME:  Sylvia Lewis, Sylvia Lewis MR#:  [id number] DATE OF BIRTH:  [birth year]  DATE OF ADMISSION:  01/31/2011  CHIEF COMPLAINT/REASON FOR ADMISSION: Persistent nausea, vomiting, and poor oral intake with clinical dehydration. Recurrent colon cancer.   HISTORY OF PRESENT ILLNESS: Patient is a 59 year old female with history of colon cancer status post surgery in 2012, was on adjuvant chemotherapy. More recently patient has had persistent nausea and vomiting. She was seen as outpatient on 01/04 and was given IV fluids for hydration. She was sent for CT scan yesterday which now reports large recurrent tumor in the upper abdomen consistent with recurrent colon cancer, she has had colon cancer removed from transverse colon previously. No other new metastatic disease reported. Now having constant nausea and vomiting, upper abdominal pain not controlled with current opioid regimen. Oral intake has been almost nil for the last 1 to 2 days. Feeling dizzy on getting up and ambulating, husband feels that she is sometimes lethargic. No fever or chills. No diarrhea or bleeding symptoms.   PAST MEDICAL HISTORY/PAST SURGICAL HISTORY:  1. Colon cancer as described above.  2. Depression.  3. Allergy/sinus symptoms.  4. Tonsillectomy and adenoidectomy.  5. Status post right hemicolectomy, transverse colectomy, excision of abdominal wall tumor and end ileostomy and mucous fistula formation on 04/16/2010.   FAMILY HISTORY: Remarkable for leukemia, colon cancer, pancreatic cancer, diabetes, skin cancer.   SOCIAL HISTORY: Ex-smoker, quit 2010 after 60 pack years. Denies history of alcohol or recreational drug usage.   ALLERGIES: Tape causes rash.   HOME MEDICATIONS:  1. Equate Allergy as needed.  2. Norco 5/325 mg 1 to 2 tablets every six hours p.r.n. for pain.  3. Ondansetron 4 mg every four hours p.r.n. for nausea, vomiting.  4. Potassium chloride 20 mEq once daily x6 days.  5. Phenergan 25 mg q.6 hours p.r.n. for  nausea.  6. Tylenol p.r.n. for pain or fever.   REVIEW OF SYSTEMS: CONSTITUTIONAL: As in history of present illness. No fevers or chills. HEENT: Having dizziness. No headaches, epistaxis, ear or jaw pain. CARDIAC: No angina, palpitation, orthopnea, or PND. LUNGS: Has mild dyspnea on exertion, dry mild cough. No hemoptysis or sputum. No chest pain. GASTROINTESTINAL: As in history of present illness. No bright red blood in stools or melena. GENITOURINARY: No dysuria, hematuria. HEMATOLOGIC: Denies bleeding symptoms. NEUROLOGIC: No focal weakness, seizures, or loss of consciousness. ENDOCRINE: No polyuria or polydipsia. Oral intake is very poor.   PHYSICAL EXAMINATION:  GENERAL: Patient is very weak looking, thin individual who is alert, oriented to self, place, person, and time and converses appropriately. No acute distress. No icterus.   VITAL SIGNS: Temperature 97.5, pulse 104, respirations 18, blood pressure 116/80.   HEENT: Normocephalic, atraumatic. Extraocular movements intact. Sclera anicteric. No oral thrush. Mouth is dry.   NECK: Supple without lymphadenopathy.  CARDIOVASCULAR: S1, S2, regular rate and rhythm.   LUNGS: Bilateral good air entry, no crepitations or rhonchi.   ABDOMEN: Soft, mild tenderness in the upper abdomen, masslike area palpable right next to  ileostomy site. Bowel sounds are present.   EXTREMITIES: No major edema or cyanosis.   SKIN: No generalized rashes.   NEUROLOGIC: Nonfocal, cranial nerves are intact.   LABORATORY, DIAGNOSTIC, AND RADIOLOGICAL DATA: Creatinine 0.65, potassium 3.4, calcium 8.7, glucose 97. Liver functions with alkaline phosphatase 210, ALT 34, AST 42, albumin 3.8, hemoglobin 11.1, WBC 5000, platelets 210, ANC 3100.   IMPRESSION AND PLAN:  1. History of colon cancer status post right hemicolectomy, transverse  colectomy for T4 lesion in March 2012, was on adjuvant FOLFOX chemotherapy. Repeat CT scan is now showing a large recurrent upper  abdominal tumor most consistent with recurrence of colon cancer. Patient having severe pain issues not controlled with Vicodin, also not able to take orally.  2. Nausea, vomiting, clinical dehydration-unable to tolerate p.o. Will admit to hospital, start on IV fluids D5 half-normal saline at 125 mL/h, continue to monitor renal functions and electrolytes and urine output. IV antiemetics with Zofran 4 mg every four hours p.r.n.  3. Will keep on morphine sulfate 2 to 4 mg IV q.1-2h. for pain control.  4. Consult GI surgical oncologist, Dr. Alveria ApleyPaul Mosca when he comes on Thursday to if surgical resection of recurrent lesion is feasible.  5. Patient and husband present explained above, agreeable to this plan.   ____________________________ Maren ReamerSandeep R. Sherrlyn HockPandit, MD srp:cms D: 01/31/2011 22:28:26 ET T: 02/01/2011 05:18:03 ET JOB#: [id number]  cc: Darryll CapersSandeep R. Sherrlyn HockPandit, MD, <Dictator> Wille CelesteSANDEEP R Kenesha Moshier MD ELECTRONICALLY SIGNED 02/01/2011 9:30
